# Patient Record
Sex: Male | Born: 1985 | Race: White | Hispanic: No | Marital: Single | Smoking: Never smoker
Health system: Southern US, Community
[De-identification: ages and names within clinical notes are randomized; demographics above are authoritative.]

## PROBLEM LIST (undated history)

## (undated) HISTORY — PX: TONSILLECTOMY: SUR1361

---

## 2018-05-03 ENCOUNTER — Emergency Department (HOSPITAL_COMMUNITY): Payer: 59

## 2018-05-03 ENCOUNTER — Emergency Department (HOSPITAL_COMMUNITY)
Admission: EM | Admit: 2018-05-03 | Discharge: 2018-05-03 | Disposition: A | Discharge status: Home / Self Care | Payer: 59 | Attending: Emergency Medicine | Admitting: Emergency Medicine

## 2018-05-03 ENCOUNTER — Encounter (HOSPITAL_COMMUNITY): Payer: Self-pay | Admitting: Emergency Medicine

## 2018-05-03 DIAGNOSIS — R1011 Right upper quadrant pain: Secondary | ICD-10-CM | POA: Insufficient documentation

## 2018-05-03 LAB — COMPREHENSIVE METABOLIC PANEL
ALBUMIN: 4.2 g/dL (ref 3.5–5.0)
ALK PHOS: 61 U/L (ref 38–126)
ALT: 36 U/L (ref 0–44)
AST: 23 U/L (ref 15–41)
Anion gap: 6 (ref 5–15)
BUN: 12 mg/dL (ref 6–20)
CALCIUM: 9.3 mg/dL (ref 8.9–10.3)
CHLORIDE: 106 mmol/L (ref 98–111)
CO2: 29 mmol/L (ref 22–32)
CREATININE: 1.21 mg/dL (ref 0.61–1.24)
GFR calc Af Amer: >60 mL/min (ref >60)
GFR calc non Af Amer: >60 mL/min (ref >60)
GLUCOSE: 96 mg/dL (ref 70–99)
Potassium: 4.6 mmol/L (ref 3.5–5.1)
Sodium: 141 mmol/L (ref 135–145)
Total Bilirubin: 0.6 mg/dL (ref 0.3–1.2)
Total Protein: 7.4 g/dL (ref 6.5–8.1)

## 2018-05-03 LAB — LIPASE, BLOOD: Lipase: 39 U/L (ref 11–51)

## 2018-05-03 LAB — CBC
HCT: 48.6 % (ref 39.0–52.0)
Hemoglobin: 16.1 g/dL (ref 13.0–17.0)
MCH: 29.1 pg (ref 26.0–34.0)
MCHC: 33.1 g/dL (ref 30.0–36.0)
MCV: 87.7 fL (ref 78.0–100.0)
Platelets: 252 10*3/uL (ref 150–400)
RBC: 5.54 MIL/uL (ref 4.22–5.81)
RDW: 12.5 % (ref 11.5–15.5)
WBC: 9.3 10*3/uL (ref 4.0–10.5)

## 2018-05-03 LAB — URINALYSIS, ROUTINE W REFLEX MICROSCOPIC
Bilirubin Urine: NEGATIVE
GLUCOSE, UA: NEGATIVE mg/dL
HGB URINE DIPSTICK: NEGATIVE
Ketones, ur: NEGATIVE mg/dL
Leukocytes, UA: NEGATIVE
Nitrite: NEGATIVE
PROTEIN: NEGATIVE mg/dL
Specific Gravity, Urine: 1.008 (ref 1.005–1.030)
pH: 6 (ref 5.0–8.0)

## 2018-05-03 MED ORDER — IOPAMIDOL (ISOVUE-300) INJECTION 61%
INTRAVENOUS | Status: AC
  Filled 2018-05-03: qty 100

## 2018-05-03 MED ORDER — SODIUM CHLORIDE 0.9 % IJ SOLN
INTRAMUSCULAR | Status: AC
  Filled 2018-05-03: qty 100

## 2018-05-03 MED ORDER — IOPAMIDOL (ISOVUE-300) INJECTION 61%
100.0000 mL | Freq: Once | INTRAVENOUS | Status: AC | PRN
  Administered 2018-05-03: 100 mL via INTRAVENOUS

## 2018-05-03 NOTE — Discharge Instructions (Addendum)
Your evaluated in the emergency department for chills and right upper quadrant right flank pain.  You had blood work and a CAT scan and urinalysis that did not show an obvious cause of your symptoms.  It will be important for you to follow-up with your doctor for further work-up of the symptoms.  CT Report -  FINDINGS:  Lower chest: No acute abnormality.     Hepatobiliary: No focal liver abnormality is seen. No gallstones,  gallbladder wall thickening, or biliary dilatation.     Pancreas: Unremarkable. No pancreatic ductal dilatation or  surrounding inflammatory changes.     Spleen: Normal in size without focal abnormality.     Adrenals/Urinary Tract: Adrenal glands appear normal. Kidneys appear  normal without mass, stone or hydronephrosis. No perinephric fluid.  No ureteral or bladder calculi. Bladder appears normal.     Stomach/Bowel: No dilated large or small bowel loops. No bowel wall  thickening or evidence of bowel wall inflammation seen. Appendix is  normal. Stomach appears normal, partially decompressed.     Vascular/Lymphatic: No significant vascular findings are present. No  enlarged abdominal or pelvic lymph nodes.     Reproductive: Prostate is unremarkable.     Other: No free fluid or abscess collection. No free intraperitoneal  air.     Musculoskeletal: No acute or suspicious osseous finding.     IMPRESSION:  Normal abdomen and pelvis CT.        Electronically Signed    By: Bary Richard M.D.    On: 05/03/2018 19:25

## 2018-05-03 NOTE — ED Provider Notes (Signed)
[facility]-EMERGENCY DEPT Provider Note   CSN: [id number] Arrival date & time: 05/03/18  1353     History   Chief Complaint Chief Complaint  Patient presents with  . Chills  . Abdominal Pain    HPI Dwayne Hernandez is a 32 y.o. male.  He is presenting here with 2 months of migratory upper abdominal pain mostly right upper quadrant to right flank.  He states he seen his primary care doctor for an ultrasound that did not show any obvious gallstones.  He also has noticed over the last 2 days some chills and sweats.  The abdominal pain is been associated with nausea and it can be sharp and severe at times and happens every day or 2.  Not associated with any diarrhea vomiting or constipation.  No urinary symptoms.  No cough.  He is on a PPI.  The history is provided by the patient.  Abdominal Pain   This is a new problem. Episode onset: 2 months. The problem occurs daily. The problem has not changed since onset.The pain is associated with an unknown factor. The pain is located in the RUQ. The quality of the pain is sharp. The pain is severe. Associated symptoms include nausea. Pertinent negatives include fever, diarrhea, constipation, dysuria, frequency and headaches. Nothing aggravates the symptoms. Nothing relieves the symptoms. Past workup includes ultrasound. His past medical history is significant for GERD.    History reviewed. No pertinent past medical history.  There are no active problems to display for this patient.   Past Surgical History:  Procedure Laterality Date  . TONSILLECTOMY          Home Medications    Prior to Admission medications   Not on File    Family History No family history on file.  Social History Social History   Tobacco Use  . Smoking status: Never Smoker  . Smokeless tobacco: Never Used  Substance Use Topics  . Alcohol use: Yes  . Drug use: Not on file     Allergies   Patient has no known allergies.   Review of  Systems Review of Systems  Constitutional: Negative for fever.  HENT: Negative for sore throat.   Eyes: Negative for visual disturbance.  Respiratory: Negative for shortness of breath.   Cardiovascular: Negative for chest pain.  Gastrointestinal: Positive for abdominal pain and nausea. Negative for constipation and diarrhea.  Genitourinary: Negative for dysuria and frequency.  Musculoskeletal: Negative for neck pain.  Skin: Negative for rash.  Neurological: Negative for headaches.     Physical Exam Updated Vital Signs BP 128/85 (BP Location: Left Arm)   Pulse 83   Temp 98.3 F (36.8 C) (Oral)   Resp 18   SpO2 97%   Physical Exam  Constitutional: He is oriented to person, place, and time. He appears well-developed and well-nourished.  HENT:  Head: Normocephalic and atraumatic.  Eyes: Conjunctivae are normal.  Neck: Neck supple.  Cardiovascular: Normal rate, regular rhythm and normal heart sounds.  No murmur heard. Pulmonary/Chest: Effort normal and breath sounds normal. No respiratory distress.  Abdominal: Soft. There is tenderness in the right upper quadrant. There is no rigidity and no guarding.  Musculoskeletal: Normal range of motion. He exhibits no edema, tenderness or deformity.  Neurological: He is alert and oriented to person, place, and time.  Skin: Skin is warm and dry. Capillary refill takes less than 2 seconds.  Psychiatric: He has a normal mood and affect.  Nursing note and vitals reviewed.  ED Treatments / Results  Labs (all labs ordered are listed, but only abnormal results are displayed) Labs Reviewed  LIPASE, BLOOD  COMPREHENSIVE METABOLIC PANEL  CBC  URINALYSIS, ROUTINE W REFLEX MICROSCOPIC    EKG None  Radiology Ct Abdomen Pelvis W Contrast  Result Date: 05/03/2018 CLINICAL DATA:  Chills for 2 days. Abdominal pain for approximately 2 months. EXAM: CT ABDOMEN AND PELVIS WITH CONTRAST TECHNIQUE: Multidetector CT imaging of the abdomen and  pelvis was performed using the standard protocol following bolus administration of intravenous contrast. CONTRAST:  ISOVUE-300 IOPAMIDOL (ISOVUE-300) INJECTION 61% COMPARISON:  None. FINDINGS: Lower chest: No acute abnormality. Hepatobiliary: No focal liver abnormality is seen. No gallstones, gallbladder wall thickening, or biliary dilatation. Pancreas: Unremarkable. No pancreatic ductal dilatation or surrounding inflammatory changes. Spleen: Normal in size without focal abnormality. Adrenals/Urinary Tract: Adrenal glands appear normal. Kidneys appear normal without mass, stone or hydronephrosis. No perinephric fluid. No ureteral or bladder calculi. Bladder appears normal. Stomach/Bowel: No dilated large or small bowel loops. No bowel wall thickening or evidence of bowel wall inflammation seen. Appendix is normal. Stomach appears normal, partially decompressed. Vascular/Lymphatic: No significant vascular findings are present. No enlarged abdominal or pelvic lymph nodes. Reproductive: Prostate is unremarkable. Other: No free fluid or abscess collection. No free intraperitoneal air. Musculoskeletal: No acute or suspicious osseous finding. IMPRESSION: Normal abdomen and pelvis CT. Electronically Signed   By: Bary Richard M.D.   On: 05/03/2018 19:25    Procedures Procedures (including critical care time)  Medications Ordered in ED Medications - No data to display   Initial Impression / Assessment and Plan / ED Course  I have reviewed the triage vital signs and the nursing notes.  Pertinent labs & imaging results that were available during my care of the patient were reviewed by me and considered in my medical decision making (see chart for details).    Lab work and ct with no obviuos explanation of symtpoms. Reviewed with patient. He understands to followup with his pcp for continued management. Instructions for return given.   Final Clinical Impressions(s) / ED Diagnoses   Final diagnoses:    Right upper quadrant abdominal pain    ED Discharge Orders    None       Terrilee Files, MD 05/04/18 [phone]

## 2018-05-03 NOTE — ED Triage Notes (Signed)
Pt c/o chills for 2 days. C/o abd pains that have going on for about 2 months without n/v/d. Had abd ultrasound to rule out gall stones.

## 2019-11-18 IMAGING — CT CT ABD-PELV W/ CM
2 of 4 series · 16 of 46 positions shown, 18 images · IV contrast (iopamidol)
Comparison: None.

CLINICAL DATA: Chills for 2 days. Abdominal pain for approximately
2 months.

EXAM:
CT ABDOMEN AND PELVIS WITH CONTRAST
TECHNIQUE: Multidetector CT imaging of the abdomen and pelvis was performed
using the standard protocol following bolus administration of
intravenous contrast.
CONTRAST:  100mL YTTNP5-1YY IOPAMIDOL (YTTNP5-1YY) INJECTION 61%

[Series 2: axial st · axial · 0.72mm/px · z∈[+1071,+1481]mm · 13 of 94 slices shown, 15 images]
[im 6/94  soft-tissue]
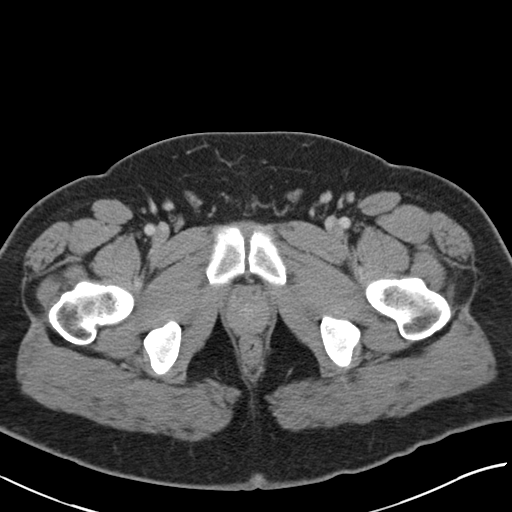
[im 6/94  bone]
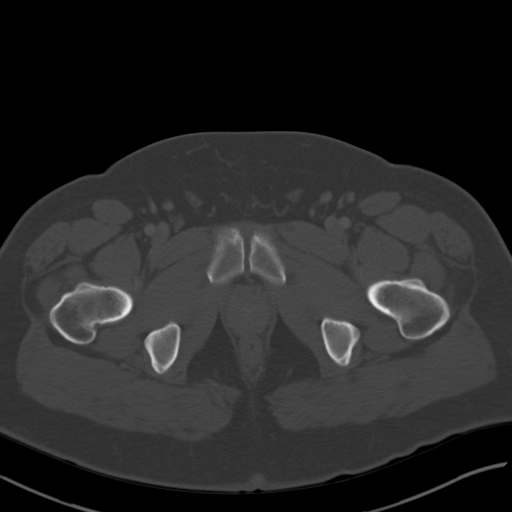
[im 11/94  soft-tissue]
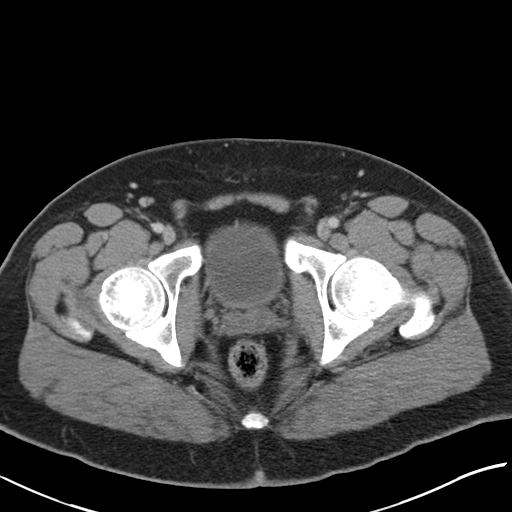
[im 21/94  soft-tissue]
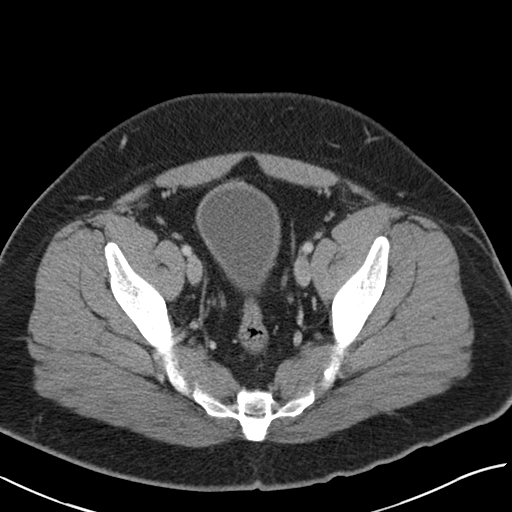
[im 26/94  soft-tissue]
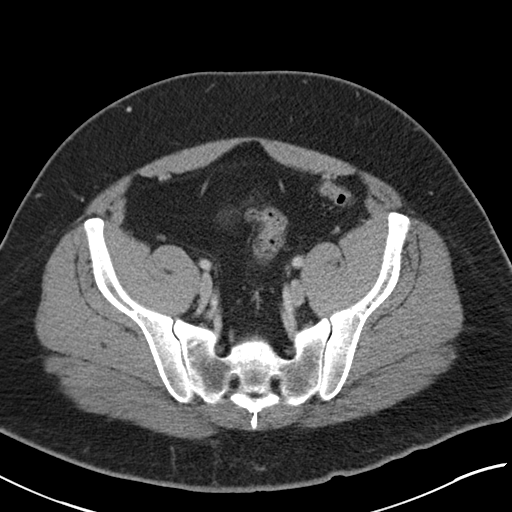
[im 32/94  soft-tissue]
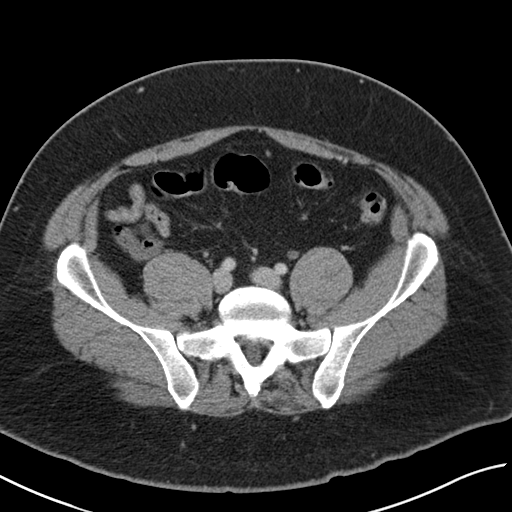
[im 42/94  soft-tissue]
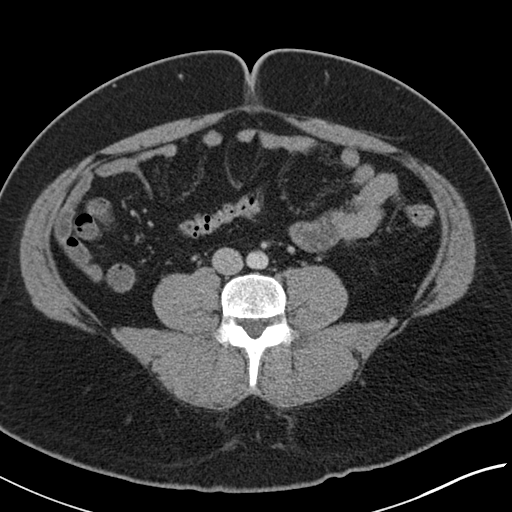
[im 47/94  soft-tissue]
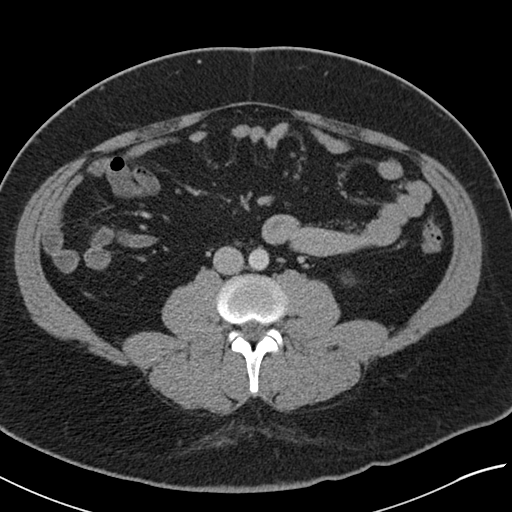
[im 52/94  soft-tissue]
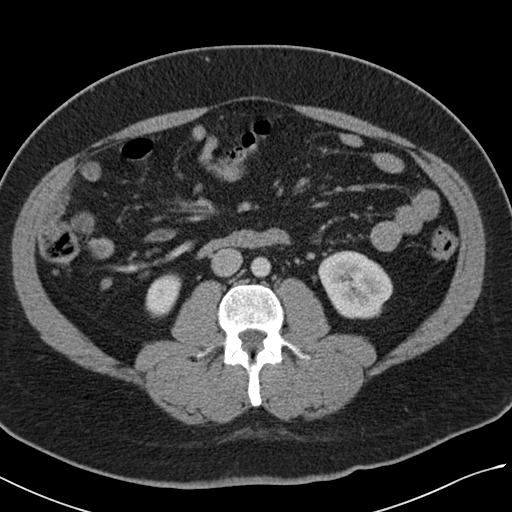
[im 63/94  soft-tissue]
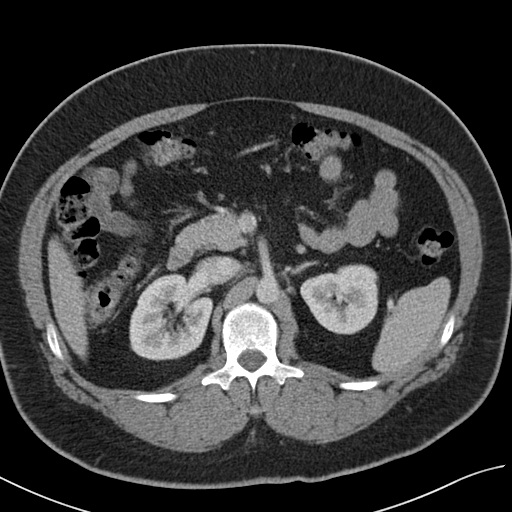
[im 63/94  bone]
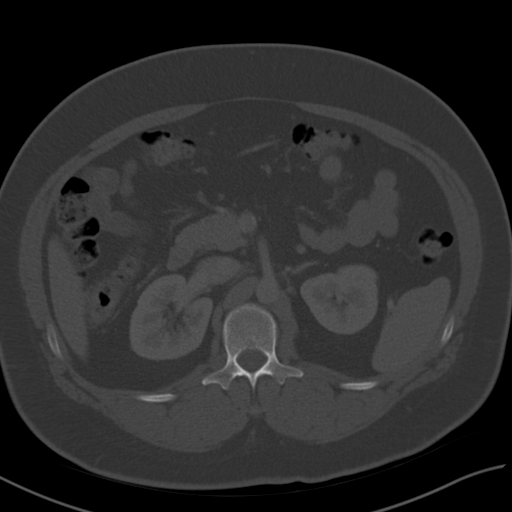
[im 68/94  soft-tissue]
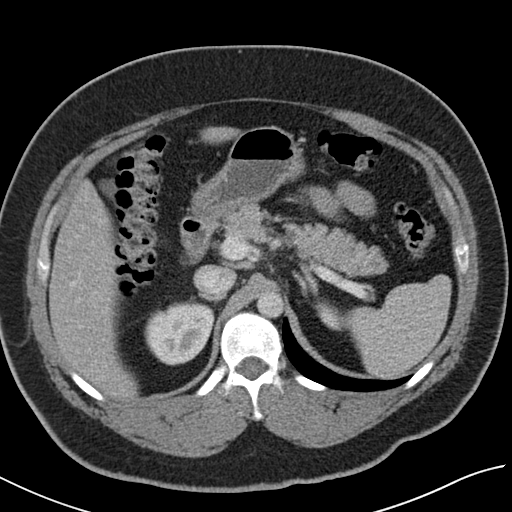
[im 73/94  soft-tissue]
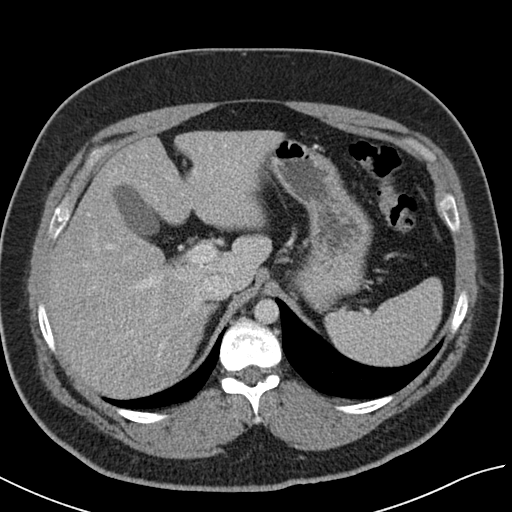
[im 83/94  soft-tissue]
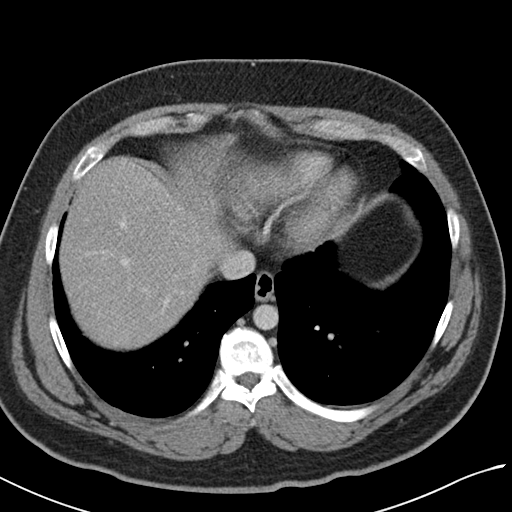
[im 88/94  soft-tissue]
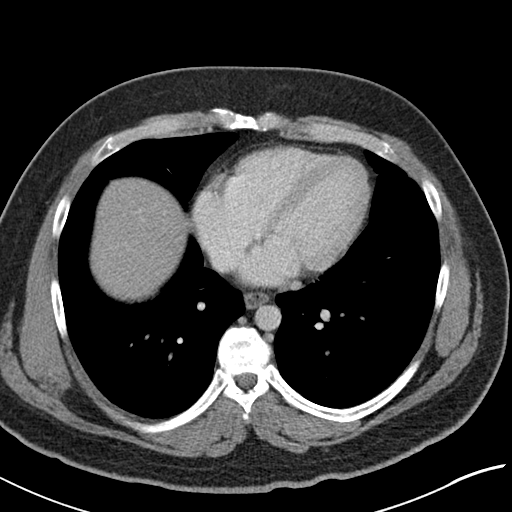

[Series 4: coronal st · coronal · 0.73mm/px · 3 of 101 slices shown]
[im 34/101  soft-tissue]
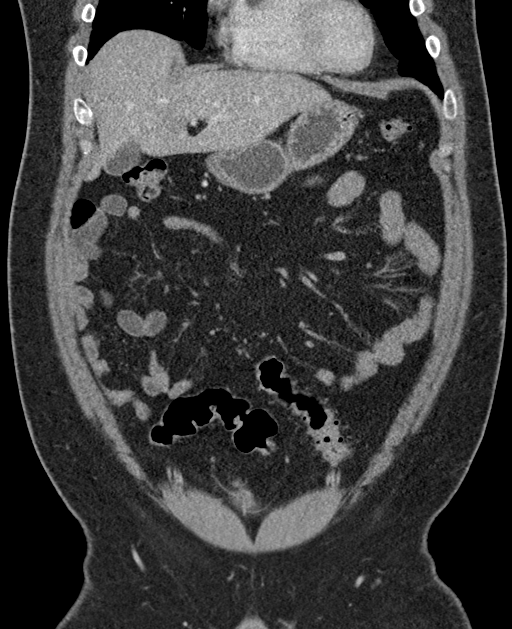
[im 45/101  soft-tissue]
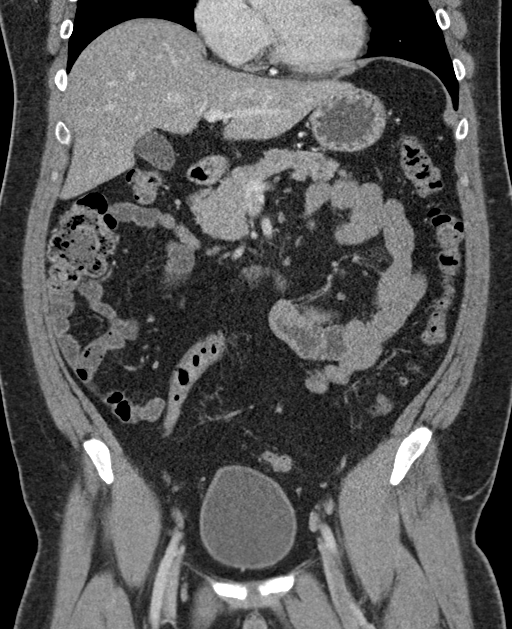
[im 56/101  soft-tissue]
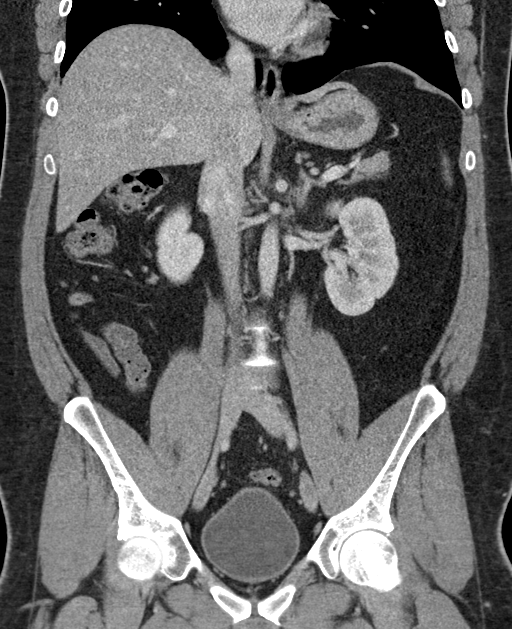

[16 of 46 positions shown; findings below may reference images not displayed]

FINDINGS: Lower chest: No acute abnormality.

Hepatobiliary: No focal liver abnormality is seen. No gallstones,
gallbladder wall thickening, or biliary dilatation.

Pancreas: Unremarkable. No pancreatic ductal dilatation or
surrounding inflammatory changes.

Spleen: Normal in size without focal abnormality.

Adrenals/Urinary Tract: Adrenal glands appear normal. Kidneys appear
normal without mass, stone or hydronephrosis. No perinephric fluid.
No ureteral or bladder calculi. Bladder appears normal.

Stomach/Bowel: No dilated large or small bowel loops. No bowel wall
thickening or evidence of bowel wall inflammation seen. Appendix is
normal. Stomach appears normal, partially decompressed.

Vascular/Lymphatic: No significant vascular findings are present. No
enlarged abdominal or pelvic lymph nodes.

Reproductive: Prostate is unremarkable.

Other: No free fluid or abscess collection. No free intraperitoneal
air.

Musculoskeletal: No acute or suspicious osseous finding.
IMPRESSION: Normal abdomen and pelvis CT.
